# Patient Record
Sex: Male | Born: 1980 | Race: White | Hispanic: No | Marital: Married | Smoking: Never smoker
Health system: Southern US, Community
[De-identification: ages and names within clinical notes are randomized; demographics above are authoritative.]

---

## 1998-05-17 ENCOUNTER — Ambulatory Visit (HOSPITAL_BASED_OUTPATIENT_CLINIC_OR_DEPARTMENT_OTHER): Admission: RE | Admit: 1998-05-17 | Discharge: 1998-05-17 | Payer: Self-pay | Admitting: *Deleted

## 2007-07-26 ENCOUNTER — Emergency Department (HOSPITAL_COMMUNITY): Admission: EM | Admit: 2007-07-26 | Discharge: 2007-07-26 | Payer: Self-pay | Admitting: Emergency Medicine

## 2010-01-10 ENCOUNTER — Emergency Department (HOSPITAL_COMMUNITY): Admission: EM | Admit: 2010-01-10 | Discharge: 2010-01-10 | Payer: Self-pay | Admitting: Emergency Medicine
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
## 2019-07-23 ENCOUNTER — Emergency Department (HOSPITAL_BASED_OUTPATIENT_CLINIC_OR_DEPARTMENT_OTHER)
Admission: EM | Admit: 2019-07-23 | Discharge: 2019-07-23 | Disposition: A | Discharge status: Home / Self Care | Payer: Self-pay | Attending: Emergency Medicine | Admitting: Emergency Medicine

## 2019-07-23 ENCOUNTER — Encounter (HOSPITAL_BASED_OUTPATIENT_CLINIC_OR_DEPARTMENT_OTHER): Payer: Self-pay

## 2019-07-23 ENCOUNTER — Other Ambulatory Visit: Payer: Self-pay

## 2019-07-23 DIAGNOSIS — B029 Zoster without complications: Secondary | ICD-10-CM | POA: Insufficient documentation

## 2019-07-23 DIAGNOSIS — F1210 Cannabis abuse, uncomplicated: Secondary | ICD-10-CM | POA: Insufficient documentation

## 2019-07-23 MED ORDER — ACYCLOVIR 400 MG PO TABS: 800.0000 mg | ORAL_TABLET | Freq: Every day | ORAL | 0 refills | Status: AC

## 2019-07-23 MED ORDER — OXYCODONE-ACETAMINOPHEN 5-325 MG PO TABS: 1.0000 | ORAL_TABLET | ORAL | 0 refills | Status: AC | PRN

## 2019-07-23 NOTE — ED Triage Notes (Signed)
Pt states that he has been having pain starting Wednesday and a rash staring yesterday on his left side, does not cross spine and follows rib line to from of abdomen. Pt reports some abdominal pain with rash. Has been taking ibuprofen at home for pain with last medication around 2 am today.

## 2019-07-23 NOTE — ED Provider Notes (Signed)
[facility] EMERGENCY DEPARTMENT Provider Note   CSN: [id number] Arrival date & time: 07/23/19  1309     History Chief Complaint  Patient presents with  . Rash    Stephen Kennedy is a 39 y.o. male.  Presents to ER with chief complaint rash.  Patient reports on Wednesday and Thursday started having some pain on his left side along his lower chest, upper abdomen.  Pain is relatively constant, tried taking some Motrin with no improvement.  Yesterday started having a rash along where his pain is.  He denies pain anywhere else, no rash anywhere else on his body, specifically denies any rash on his face, mouth, genitals.  No confusion, no fever, no vision change.  Denies prior history of any medical problems, specifically denies any immunocompromise state.  Does not have primary care doctor.  HPI     History reviewed. No pertinent past medical history.  There are no problems to display for this patient.   History reviewed. No pertinent surgical history.     No family history on file.  Social History   Tobacco Use  . Smoking status: Never Smoker  . Smokeless tobacco: Never Used  Substance Use Topics  . Alcohol use: Never  . Drug use: Yes    Types: Marijuana    Comment: daily     Home Medications Prior to Admission medications   Medication Sig Start Date End Date Taking? Authorizing Provider  acyclovir (ZOVIRAX) 400 MG tablet Take 2 tablets (800 mg total) by mouth 5 (five) times daily for 7 days. 07/23/19 07/30/19  Milagros Loll, MD  oxyCODONE-acetaminophen (PERCOCET) 5-325 MG tablet Take 1 tablet by mouth every 4 (four) hours as needed for up to 3 days for severe pain. 07/23/19 07/26/19  Milagros Loll, MD    Allergies    Codeine  Review of Systems   Review of Systems  Constitutional: Negative for chills and fever.  HENT: Negative for ear pain and sore throat.   Eyes: Negative for pain and visual disturbance.  Respiratory: Negative for cough and  shortness of breath.   Cardiovascular: Negative for chest pain and palpitations.  Gastrointestinal: Negative for abdominal pain and vomiting.  Genitourinary: Negative for dysuria and hematuria.  Musculoskeletal: Negative for arthralgias and back pain.  Skin: Negative for color change and rash.  Neurological: Negative for seizures and syncope.  All other systems reviewed and are negative.   Physical Exam Updated Vital Signs BP (!) 134/99 (BP Location: Right Arm)   Pulse 97   Temp 99.2 F (37.3 C) (Oral)   Resp 18   Ht 6' (1.829 m)   Wt 77.1 kg   SpO2 100%   BMI 23.06 kg/m   Physical Exam Constitutional:      Appearance: Normal appearance.  HENT:     Head: Normocephalic and atraumatic.     Comments: No rash over face, ears, nose    Right Ear: Tympanic membrane, ear canal and external ear normal.     Left Ear: Tympanic membrane, ear canal and external ear normal.     Nose: Nose normal.     Mouth/Throat:     Mouth: Mucous membranes are moist.  Eyes:     Extraocular Movements: Extraocular movements intact.     Pupils: Pupils are equal, round, and reactive to light.  Cardiovascular:     Rate and Rhythm: Normal rate.     Pulses: Normal pulses.  Pulmonary:     Effort: Pulmonary effort is  normal. No respiratory distress.  Abdominal:     General: Abdomen is flat. There is no distension.     Tenderness: There is no abdominal tenderness.  Musculoskeletal:     Cervical back: Normal range of motion and neck supple.  Skin:    Capillary Refill: Capillary refill takes less than 2 seconds.     Comments: Mildly raised blanchable vesicular rash along left mid thoracic back extending over flank to upper abdomen, does not cross midline; no rash over remainder of skin  Neurological:     General: No focal deficit present.     Mental Status: He is alert and oriented to person, place, and time.     ED Results / Procedures / Treatments   Labs (all labs ordered are listed, but only  abnormal results are displayed) Labs Reviewed - No data to display  EKG None  Radiology No results found.  Procedures Procedures (including critical care time)  Medications Ordered in ED Medications - No data to display  ED Course  I have reviewed the triage vital signs and the nursing notes.  Pertinent labs & imaging results that were available during my care of the patient were reviewed by me and considered in my medical decision making (see chart for details).    MDM Rules/Calculators/A&P                      39 year old male with left side pain, rash.  On exam concern for zoster, fits dermatomal pattern around the level of T8.  Patient is otherwise well-appearing and has no other associated symptoms.  No evidence for disseminated zoster, not immunocompromised.  Will give Rx for Percocet, acyclovir.  Recommend recheck with primary doctor, provided information for the health and wellness.  Reviewed return precautions.     After the discussed management above, the patient was determined to be safe for discharge.  The patient was in agreement with this plan and all questions regarding their care were answered.  ED return precautions were discussed and the patient will return to the ED with any significant worsening of condition.   Final Clinical Impression(s) / ED Diagnoses Final diagnoses:  Herpes zoster without complication    Rx / DC Orders ED Discharge Orders         Ordered    oxyCODONE-acetaminophen (PERCOCET) 5-325 MG tablet  Every 4 hours PRN     07/23/19 1412    acyclovir (ZOVIRAX) 400 MG tablet  5 times daily     07/23/19 1412           Lucrezia Starch, MD 07/23/19 1439

## 2019-07-23 NOTE — ED Notes (Signed)
ED Provider at bedside. 

## 2019-07-23 NOTE — Discharge Instructions (Signed)
Please take the antiviral medication as prescribed to help with your rash.  Take pain medication as prescribed for your pain as needed.  Note this can make you drowsy and should not be taken while driving or operating heavy machinery.  Return to ER if the rash spreads to another area of your body, especially if you have any spread to your face.  Recommend follow-up with primary care doctor for recheck later this coming week.  Additionally return to ER if you have any nausea, vomiting, confusion, other new concerning symptom.
